# Patient Record
Sex: Female | Born: 1950 | Hispanic: No | Marital: Single | Smoking: Never smoker
Health system: Southern US, Community
[De-identification: ages and names within clinical notes are randomized; demographics above are authoritative.]

## PROBLEM LIST (undated history)

## (undated) DIAGNOSIS — M1990 Unspecified osteoarthritis, unspecified site: Secondary | ICD-10-CM

---

## 2004-04-25 ENCOUNTER — Ambulatory Visit: Payer: Self-pay | Admitting: Internal Medicine

## 2005-08-10 ENCOUNTER — Emergency Department (HOSPITAL_COMMUNITY): Admission: EM | Admit: 2005-08-10 | Discharge: 2005-08-10 | Payer: Self-pay | Admitting: Family Medicine

## 2005-08-25 ENCOUNTER — Emergency Department (HOSPITAL_COMMUNITY): Admission: EM | Admit: 2005-08-25 | Discharge: 2005-08-25 | Payer: Self-pay | Admitting: Family Medicine

## 2016-11-23 ENCOUNTER — Encounter (HOSPITAL_COMMUNITY): Payer: Self-pay

## 2016-11-23 DIAGNOSIS — R1013 Epigastric pain: Secondary | ICD-10-CM | POA: Diagnosis not present

## 2016-11-23 DIAGNOSIS — R51 Headache: Secondary | ICD-10-CM | POA: Diagnosis not present

## 2016-11-23 DIAGNOSIS — R109 Unspecified abdominal pain: Secondary | ICD-10-CM | POA: Diagnosis present

## 2016-11-23 LAB — COMPREHENSIVE METABOLIC PANEL
ALBUMIN: 4.4 g/dL (ref 3.5–5.0)
ALK PHOS: 57 U/L (ref 38–126)
ALT: 22 U/L (ref 14–54)
AST: 30 U/L (ref 15–41)
Anion gap: 11 (ref 5–15)
BILIRUBIN TOTAL: 0.7 mg/dL (ref 0.3–1.2)
BUN: 8 mg/dL (ref 6–20)
CALCIUM: 9.7 mg/dL (ref 8.9–10.3)
CO2: 22 mmol/L (ref 22–32)
Chloride: 107 mmol/L (ref 101–111)
Creatinine, Ser: 0.9 mg/dL (ref 0.44–1.00)
GFR calc Af Amer: >60 mL/min (ref >60)
GFR calc non Af Amer: >60 mL/min (ref >60)
GLUCOSE: 122 mg/dL — AB (ref 65–99)
Potassium: 3.4 mmol/L — ABNORMAL LOW (ref 3.5–5.1)
Sodium: 140 mmol/L (ref 135–145)
TOTAL PROTEIN: 7.7 g/dL (ref 6.5–8.1)

## 2016-11-23 LAB — CBC
HEMATOCRIT: 38.7 % (ref 36.0–46.0)
Hemoglobin: 12.7 g/dL (ref 12.0–15.0)
MCH: 29.5 pg (ref 26.0–34.0)
MCHC: 32.8 g/dL (ref 30.0–36.0)
MCV: 90 fL (ref 78.0–100.0)
Platelets: 231 10*3/uL (ref 150–400)
RBC: 4.3 MIL/uL (ref 3.87–5.11)
RDW: 13.2 % (ref 11.5–15.5)
WBC: 7.5 10*3/uL (ref 4.0–10.5)

## 2016-11-23 LAB — LIPASE, BLOOD: Lipase: 73 U/L — ABNORMAL HIGH (ref 11–51)

## 2016-11-23 NOTE — ED Triage Notes (Signed)
Pt reports upper abdominal pain and headache. She states she is taking abx for current UTI and has been having dysuria. She denies having chest pain. LBM today.

## 2016-11-24 ENCOUNTER — Emergency Department (HOSPITAL_COMMUNITY)
Admission: EM | Admit: 2016-11-24 | Discharge: 2016-11-24 | Disposition: A | Discharge status: Home / Self Care | Payer: Medicare Other | Attending: Emergency Medicine | Admitting: Emergency Medicine

## 2016-11-24 ENCOUNTER — Emergency Department (HOSPITAL_COMMUNITY): Payer: Medicare Other

## 2016-11-24 DIAGNOSIS — R1013 Epigastric pain: Secondary | ICD-10-CM | POA: Diagnosis not present

## 2016-11-24 HISTORY — DX: Unspecified osteoarthritis, unspecified site: M19.90

## 2016-11-24 LAB — URINALYSIS, ROUTINE W REFLEX MICROSCOPIC
BACTERIA UA: NONE SEEN
Bilirubin Urine: NEGATIVE
Glucose, UA: NEGATIVE mg/dL
Hgb urine dipstick: NEGATIVE
Ketones, ur: NEGATIVE mg/dL
Nitrite: NEGATIVE
PROTEIN: NEGATIVE mg/dL
Specific Gravity, Urine: 1.005 (ref 1.005–1.030)
pH: 7 (ref 5.0–8.0)

## 2016-11-24 LAB — POC OCCULT BLOOD, ED: FECAL OCCULT BLD: NEGATIVE

## 2016-11-24 LAB — TROPONIN I

## 2016-11-24 MED ORDER — GI COCKTAIL ~~LOC~~
30.0000 mL | Freq: Once | ORAL | Status: AC
  Administered 2016-11-24: 30 mL via ORAL
  Filled 2016-11-24: qty 30

## 2016-11-24 MED ORDER — IOPAMIDOL (ISOVUE-300) INJECTION 61%
INTRAVENOUS | Status: AC
  Administered 2016-11-24: 100 mL
  Filled 2016-11-24: qty 100

## 2016-11-24 MED ORDER — OMEPRAZOLE 20 MG PO CPDR: 20.0000 mg | DELAYED_RELEASE_CAPSULE | Freq: Every day | ORAL | 0 refills | Status: AC

## 2016-11-24 MED ORDER — METOCLOPRAMIDE HCL 5 MG/ML IJ SOLN
10.0000 mg | Freq: Once | INTRAMUSCULAR | Status: AC
  Administered 2016-11-24: 10 mg via INTRAVENOUS
  Filled 2016-11-24: qty 2

## 2016-11-24 MED ORDER — SUCRALFATE 1 G PO TABS: 1.0000 g | ORAL_TABLET | Freq: Three times a day (TID) | ORAL | 0 refills | Status: AC

## 2016-11-24 MED ORDER — PANTOPRAZOLE SODIUM 40 MG IV SOLR
40.0000 mg | Freq: Once | INTRAVENOUS | Status: AC
  Administered 2016-11-24: 40 mg via INTRAVENOUS
  Filled 2016-11-24: qty 40

## 2016-11-24 MED ORDER — DIPHENHYDRAMINE HCL 50 MG/ML IJ SOLN
25.0000 mg | Freq: Once | INTRAMUSCULAR | Status: AC
  Administered 2016-11-24: 25 mg via INTRAVENOUS
  Filled 2016-11-24: qty 1

## 2016-11-24 NOTE — Discharge Instructions (Signed)
Take the stomach medications as prescribed. Followup with the stomach doctor. Your arthritis medicine may be irritating your stomach.  Ask your doctor if you can stop these. Return to the ED if you develop new or worsening symptoms.

## 2016-11-24 NOTE — ED Provider Notes (Signed)
[facility]-EMERGENCY DEPT Provider Note   CSN: [id number] Arrival date & time: 11/23/16  2006 By signing my name below, I, Margaret HedgerElizabeth Cantrell, attest that this documentation has been prepared under the direction and in the presence of [name], [name], MD . Electronically Signed: Levon HedgerElizabeth Cantrell, Scribe. 11/24/2016. 2:37 AM.   History   Chief Complaint Chief Complaint  Patient presents with  . Abdominal Pain   HPI Margaret Cantrell is a 66 y.o. female with a hx of arthritis on methotrexate and prednisone, ulcers, and HTN who presents to the Emergency Department complaining of intermittent abdominal pain onset one week ago. Pain is unchanged by inspiration. No alleviating or modifying factors noted. Pt's daughter reports associated weakness, nausea and back pain. Per daughter, she has been unable to eat due to her symptoms. She also describes a gradual onset, gradually worsening bilateral temporal headache with associated photophobia x1 week. Pt was seen by her PCP last week where she was diagnosed with a UTI  and started on Prilosec, Topamax and Macrobid. She has been compliant with her medications with no significant relief of symptoms. No abdominal SHx. No hx endoscopy.Daughter is unsure of any melena. She denies any vomiting, or decreased appetite.    The history is provided by the patient. No language interpreter was used.   Past Medical History:  Diagnosis Date  . Arthritis     There are no active problems to display for this patient.   History reviewed. No pertinent surgical history.  OB History    No data available      Home Medications    Prior to Admission medications   Not on File    Family History No family history on file.  Social History Social History  Substance Use Topics  . Smoking status: Never Smoker  . Smokeless tobacco: Never Used  . Alcohol use No   Allergies   Patient has no allergy information on record.   Review of Systems Review of Systems All systems  reviewed and are negative for acute change except as noted in the HPI.  Physical Exam Updated Vital Signs BP (!) 150/79 (BP Location: Left Arm)   Pulse 84   Temp 97.9 F (36.6 C) (Oral)   Resp 18   SpO2 100%   Physical Exam  Constitutional: She is oriented to person, place, and time. She appears well-developed and well-nourished. No distress.  Appears well  HENT:  Head: Normocephalic and atraumatic.  Mouth/Throat: Oropharynx is clear and moist. No oropharyngeal exudate.  Eyes: Conjunctivae and EOM are normal. Pupils are equal, round, and reactive to light.  Neck: Normal range of motion. Neck supple.  No meningismus.  Cardiovascular: Normal rate, regular rhythm, normal heart sounds and intact distal pulses.   No murmur heard. Pulmonary/Chest: Effort normal and breath sounds normal. No respiratory distress.  Abdominal: Soft. There is tenderness in the epigastric area and periumbilical area. There is no rebound, no guarding and no CVA tenderness.  Musculoskeletal: Normal range of motion. She exhibits no edema or tenderness.  Neurological: She is alert and oriented to person, place, and time. No cranial nerve deficit. She exhibits normal muscle tone. Coordination normal.   5/5 strength throughout. CN 2-12 intact.Equal grip strength.   Skin: Skin is warm.  Psychiatric: She has a normal mood and affect. Her behavior is normal.  Nursing note and vitals reviewed.  ED Treatments / Results  DIAGNOSTIC STUDIES:  Oxygen Saturation is 100% on RA, normal by my interpretation.    COORDINATION OF CARE:  2:33 AM Discussed treatment plan with pt at bedside and pt agreed to plan.   Labs (all labs ordered are listed, but only abnormal results are displayed) Labs Reviewed  LIPASE, BLOOD - Abnormal; Notable for the following:       Result Value   Lipase 73 (*)    All other components within normal limits  COMPREHENSIVE METABOLIC PANEL - Abnormal; Notable for the following:    Potassium 3.4  (*)    Glucose, Bld 122 (*)    All other components within normal limits  CBC  URINALYSIS, ROUTINE W REFLEX MICROSCOPIC    EKG  EKG Interpretation None       Radiology Ct Head Wo Contrast  Result Date: 11/24/2016 CLINICAL DATA:  Headache and hypertension. Previous contrast-enhanced CT abdomen and pelvis. EXAM: CT HEAD WITHOUT CONTRAST TECHNIQUE: Contiguous axial images were obtained from the base of the skull through the vertex without intravenous contrast. COMPARISON:  None. FINDINGS: Brain: Residual contrast material limits evaluation. Can't entirely exclude subtle intracranial hemorrhage. As visualized, there is no definite evidence of hemorrhage. No abnormal extra-axial fluid collections. Gray-white matter junctions are distinct. Basal cisterns are not effaced. No ventricular dilatation. No mass-effect or midline shift. Vascular: No hyperdense vessel or unexpected calcification. Skull: Normal. Negative for fracture or focal lesion. Sinuses/Orbits: No acute finding. Other: None. IMPRESSION: No acute intracranial abnormalities. Electronically Signed   By: Burman Nieves M.D.   On: 11/24/2016 06:23   Ct Abdomen Pelvis W Contrast  Result Date: 11/24/2016 CLINICAL DATA:  Epigastric pain for 5 days. EXAM: CT ABDOMEN AND PELVIS WITH CONTRAST TECHNIQUE: Multidetector CT imaging of the abdomen and pelvis was performed using the standard protocol following bolus administration of intravenous contrast. CONTRAST:  ISOVUE-300 IOPAMIDOL (ISOVUE-300) INJECTION 61% COMPARISON:  None. FINDINGS: Lower chest: Mild dependent changes in the lung bases. Hepatobiliary: Subcentimeter cyst in the left lobe of the liver. No other focal lesions identified. Gallbladder and bile ducts are unremarkable. Pancreas: Unremarkable. No pancreatic ductal dilatation or surrounding inflammatory changes. Spleen: Normal in size without focal abnormality. Adrenals/Urinary Tract: Adrenal glands are unremarkable. Kidneys are  normal, without renal calculi, focal lesion, or hydronephrosis. Bladder is unremarkable. Stomach/Bowel: Stomach is within normal limits. Appendix appears normal. No evidence of bowel wall thickening, distention, or inflammatory changes. Vascular/Lymphatic: Aortic atherosclerosis. No enlarged abdominal or pelvic lymph nodes. Reproductive: Status post hysterectomy. No adnexal masses. Other: No abdominal wall hernia or abnormality. No abdominopelvic ascites. Musculoskeletal: No acute or significant osseous findings. IMPRESSION: No acute process demonstrated in the abdomen or pelvis. No evidence of bowel obstruction or inflammation. Electronically Signed   By: Burman Nieves M.D.   On: 11/24/2016 04:23    Procedures Procedures (including critical care time)  Medications Ordered in ED Medications - No data to display   Initial Impression / Assessment and Plan / ED Course  I have reviewed the triage vital signs and the nursing notes.  Pertinent labs & imaging results that were available during my care of the patient were reviewed by me and considered in my medical decision making (see chart for details).    One week of epigastric pain and headache. Some nausea and decreased appetite. Recently treated for UTI.  Well appearing, abdomen soft without peritoneal signs. Labs with minimal lipase elevation, normal LFTs. EKG nsr. Troponin negative. Doubt ACS.  ONgoing symptoms x 5 days.   Suspect gastritis/esophagitis due to chronic steroids and MTX/  No melena. FOBT negative. CT negative for acute process.  Start PPI,  carafate. D/w PCP cessation of prednisone. Avoid alcohol, NSAIDs, caffeine.  Followup with GI for EGD.  Return precautions discussed.  Final Clinical Impressions(s) / ED Diagnoses   Final diagnoses:  Epigastric pain    New Prescriptions New Prescriptions   No medications on file   I personally performed the services described in this documentation, which was scribed in my  presence. The recorded information has been reviewed and is accurate.   Glynn Octave, MD 11/24/16 2038

## 2016-12-24 ENCOUNTER — Other Ambulatory Visit: Payer: Self-pay | Admitting: Gastroenterology

## 2016-12-24 DIAGNOSIS — R748 Abnormal levels of other serum enzymes: Secondary | ICD-10-CM

## 2017-01-05 ENCOUNTER — Ambulatory Visit
Admission: RE | Admit: 2017-01-05 | Discharge: 2017-01-05 | Disposition: A | Discharge status: Home / Self Care | Payer: Medicare Other | Source: Ambulatory Visit | Attending: Gastroenterology | Admitting: Gastroenterology

## 2017-01-05 DIAGNOSIS — R748 Abnormal levels of other serum enzymes: Secondary | ICD-10-CM

## 2017-01-05 MED ORDER — GADOBENATE DIMEGLUMINE 529 MG/ML IV SOLN
10.0000 mL | Freq: Once | INTRAVENOUS | Status: AC | PRN
  Administered 2017-01-05: 10 mL via INTRAVENOUS

## 2017-07-23 ENCOUNTER — Other Ambulatory Visit: Payer: Self-pay | Admitting: Gastroenterology

## 2017-07-23 DIAGNOSIS — R1013 Epigastric pain: Secondary | ICD-10-CM

## 2017-07-26 ENCOUNTER — Other Ambulatory Visit: Payer: Medicare Other

## 2017-07-28 ENCOUNTER — Ambulatory Visit
Admission: RE | Admit: 2017-07-28 | Discharge: 2017-07-28 | Disposition: A | Discharge status: Home / Self Care | Payer: Medicare Other | Source: Ambulatory Visit | Attending: Gastroenterology | Admitting: Gastroenterology

## 2017-07-28 DIAGNOSIS — R1013 Epigastric pain: Secondary | ICD-10-CM

## 2017-07-28 MED ORDER — IOPAMIDOL (ISOVUE-300) INJECTION 61%
100.0000 mL | Freq: Once | INTRAVENOUS | Status: AC | PRN
  Administered 2017-07-28: 100 mL via INTRAVENOUS

## 2019-02-27 IMAGING — CT CT HEAD W/O CM
3 series · 14 of 47 positions shown, 16 images · non-contrast
Comparison: None.

CLINICAL DATA: Headache and hypertension. Previous
contrast-enhanced CT abdomen and pelvis.

EXAM:
CT HEAD WITHOUT CONTRAST
TECHNIQUE: Contiguous axial images were obtained from the base of the skull
through the vertex without intravenous contrast.

[Series 2: head 5.0 h30s · axial · 0.41mm/px · z∈[-133,-8]mm · 8 of 31 slices shown, 10 images]
[im 3/31  brain]
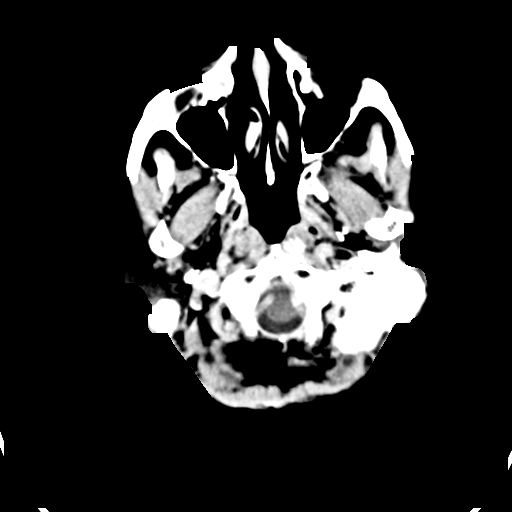
[im 3/31  bone]
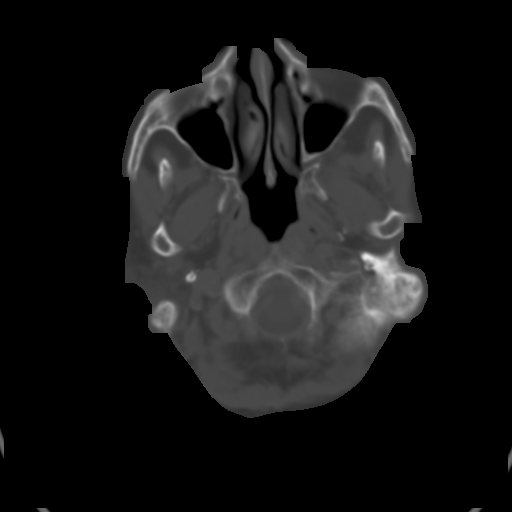
[im 7/31  brain]
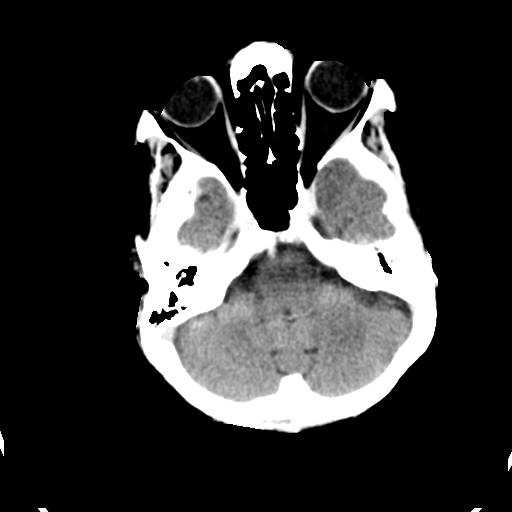
[im 10/31  brain]
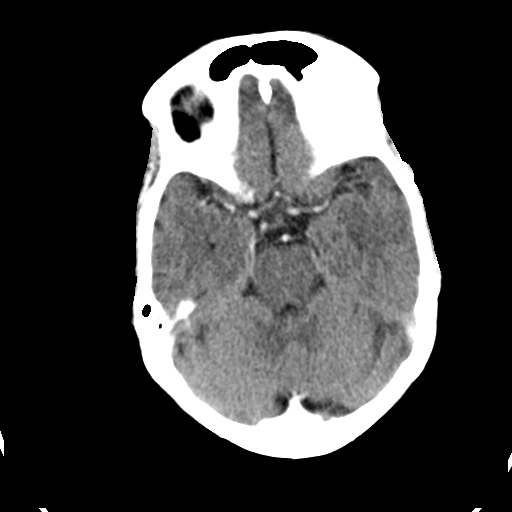
[im 14/31  brain]
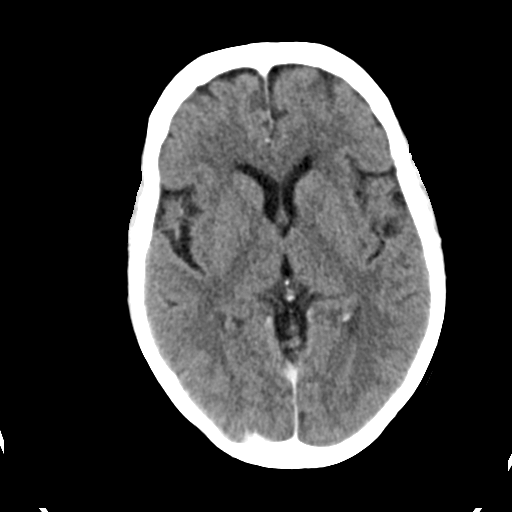
[im 17/31  brain]
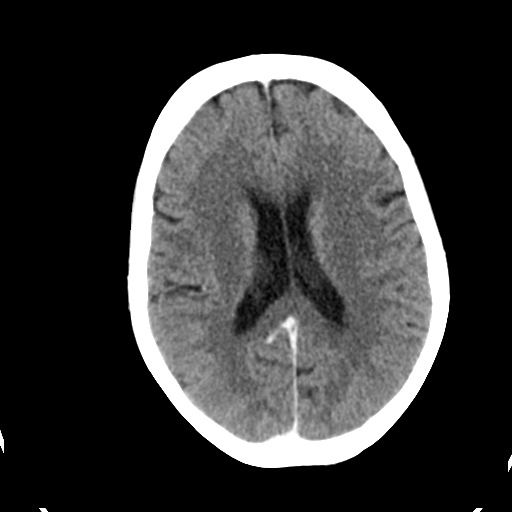
[im 17/31  bone]
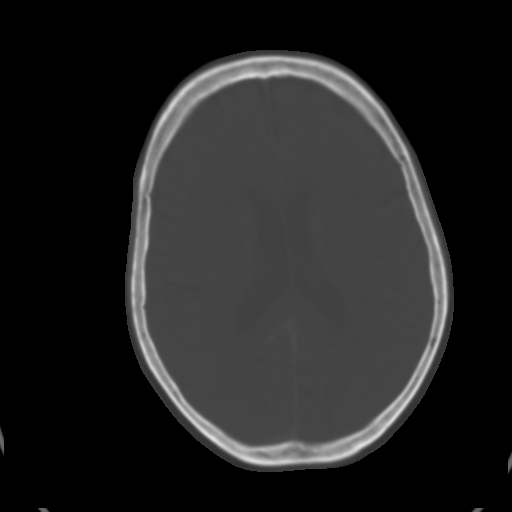
[im 21/31  brain]
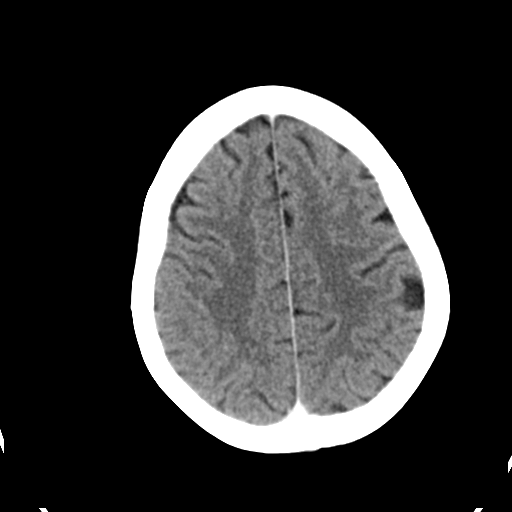
[im 24/31  brain]
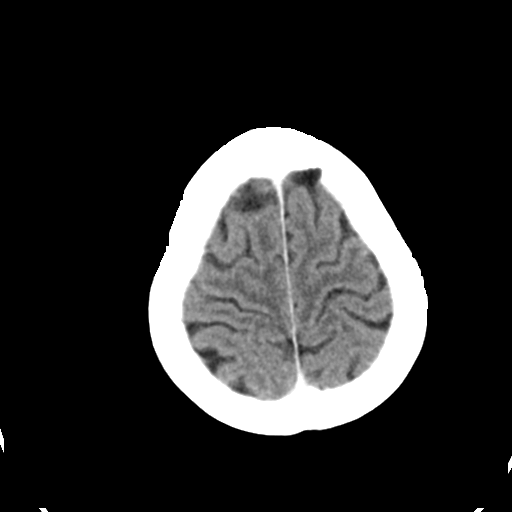
[im 28/31  brain]
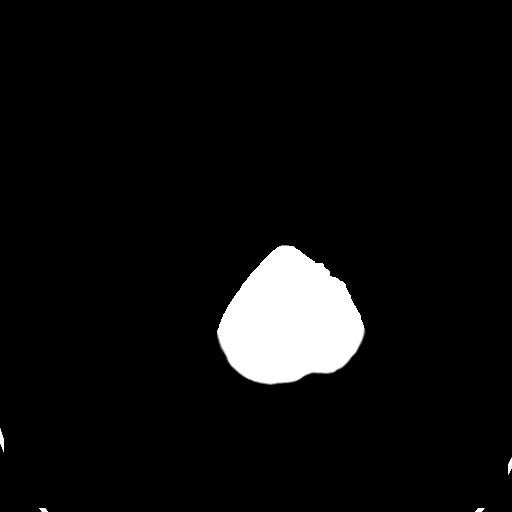

[Series 4: head 3.0 mpr cor · coronal · 0.30mm/px · 3 of 67 slices shown]
[im 23/67  brain]
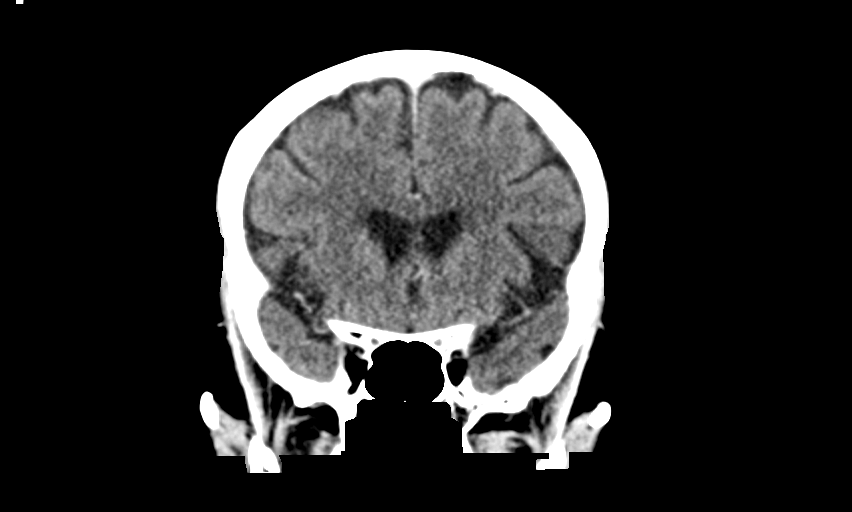
[im 30/67  brain]
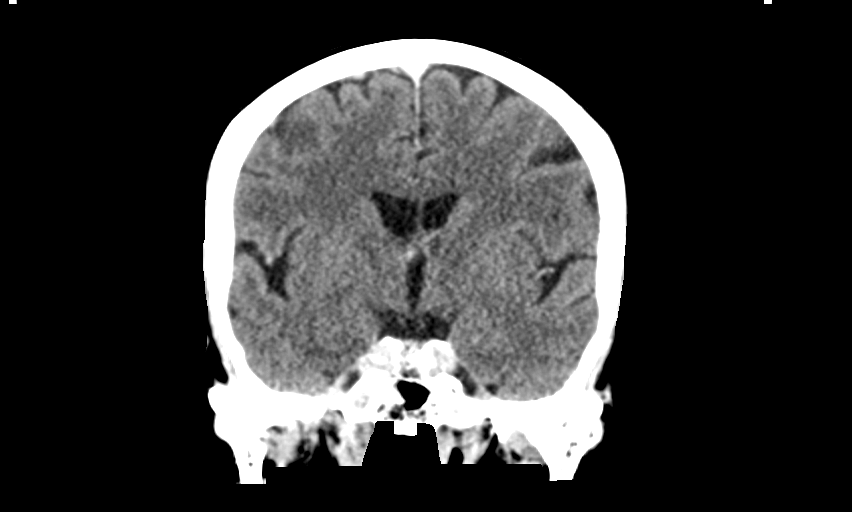
[im 37/67  brain]
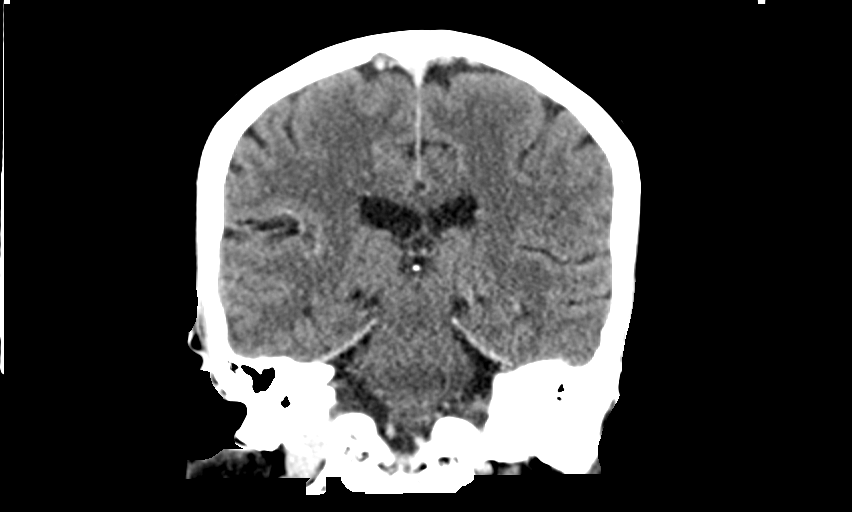

[Series 5: head 3.0 mpr sag · sagittal · 0.30mm/px · 3 of 51 slices shown]
[im 17/51  brain]
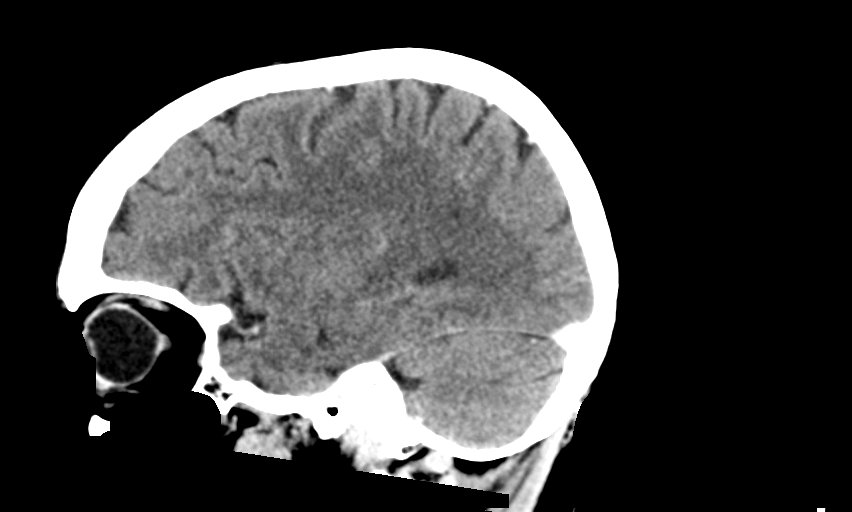
[im 26/51  brain]
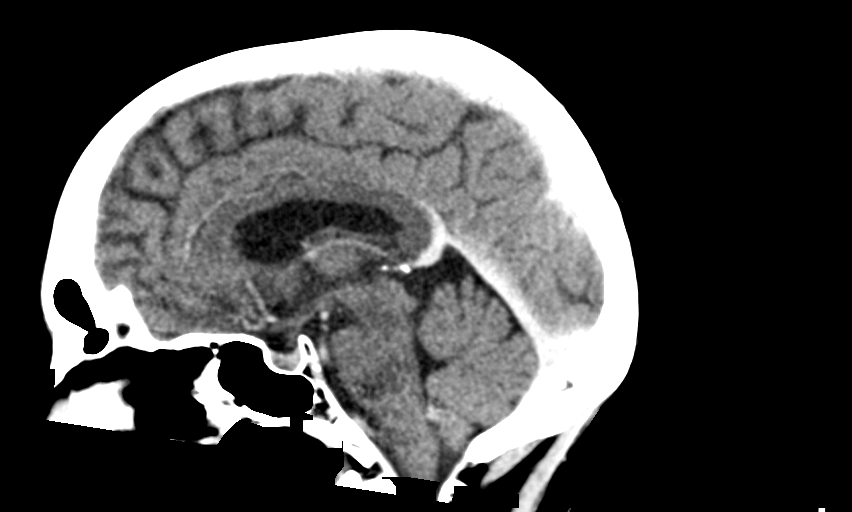
[im 34/51  brain]
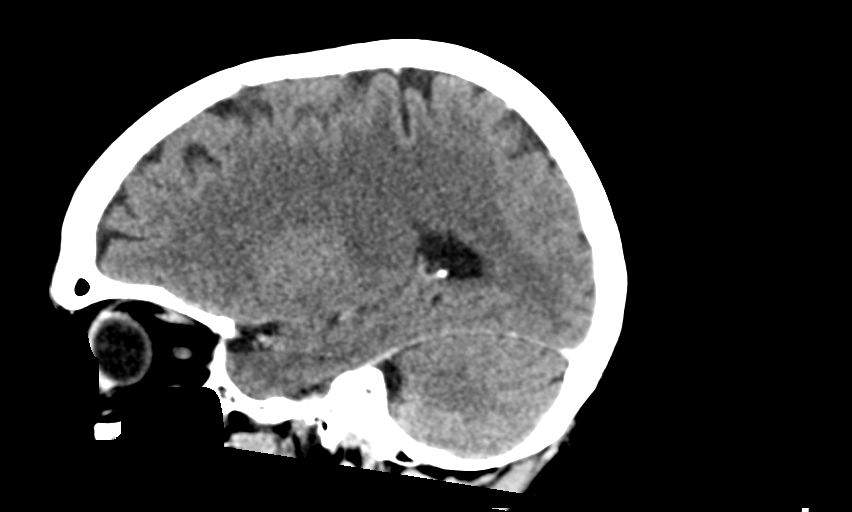

[14 of 47 positions shown; findings below may reference images not displayed]

FINDINGS: Brain: Residual contrast material limits evaluation. Can't entirely
exclude subtle intracranial hemorrhage. As visualized, there is no
definite evidence of hemorrhage. No abnormal extra-axial fluid
collections. Gray-white matter junctions are distinct. Basal
cisterns are not effaced. No ventricular dilatation. No mass-effect
or midline shift.

Vascular: No hyperdense vessel or unexpected calcification.

Skull: Normal. Negative for fracture or focal lesion.

Sinuses/Orbits: No acute finding.

Other: None.
IMPRESSION: No acute intracranial abnormalities.

## 2019-02-27 IMAGING — CT CT ABD-PELV W/ CM
2 of 5 series · 16 of 46 positions shown, 18 images · IV contrast (Omni 300)
Comparison: None.

CLINICAL DATA: Epigastric pain for 5 days.

EXAM:
CT ABDOMEN AND PELVIS WITH CONTRAST
TECHNIQUE: Multidetector CT imaging of the abdomen and pelvis was performed
using the standard protocol following bolus administration of
intravenous contrast.
CONTRAST:  100mL 3JWHPM-HXX IOPAMIDOL (3JWHPM-HXX) INJECTION 61%

[Series 3: a/p w/ 5mm · axial · 0.72mm/px · z∈[+435,+790]mm · 13 of 83 slices shown, 15 images]
[im 6/83  soft-tissue]
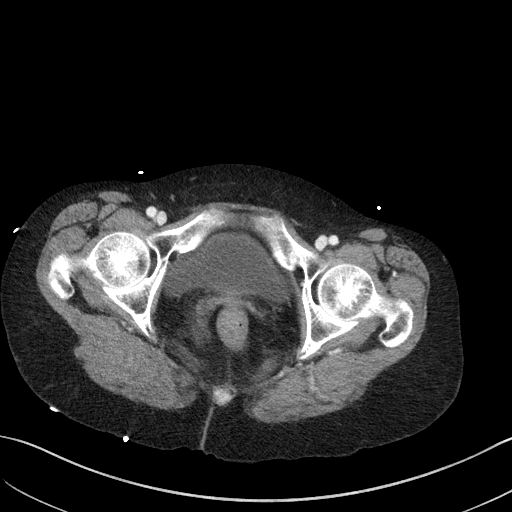
[im 6/83  bone]
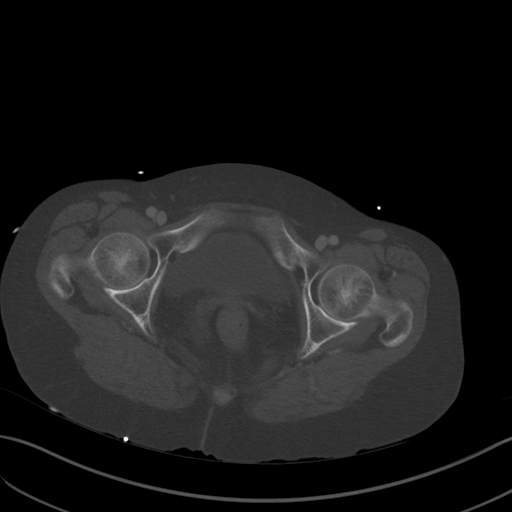
[im 11/83  soft-tissue]
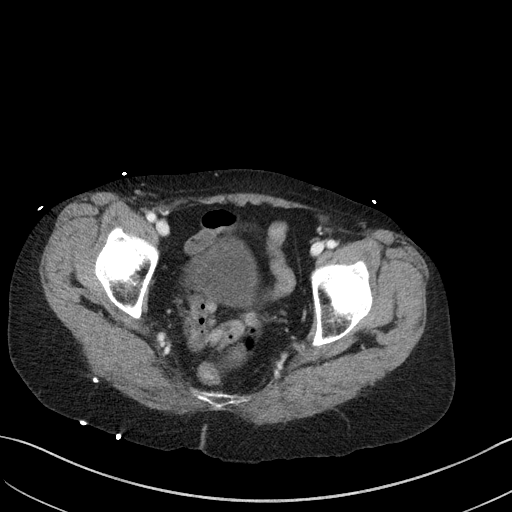
[im 17/83  soft-tissue]
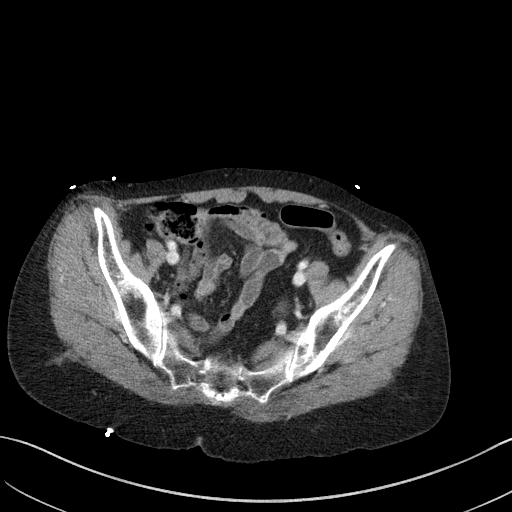
[im 22/83  soft-tissue]
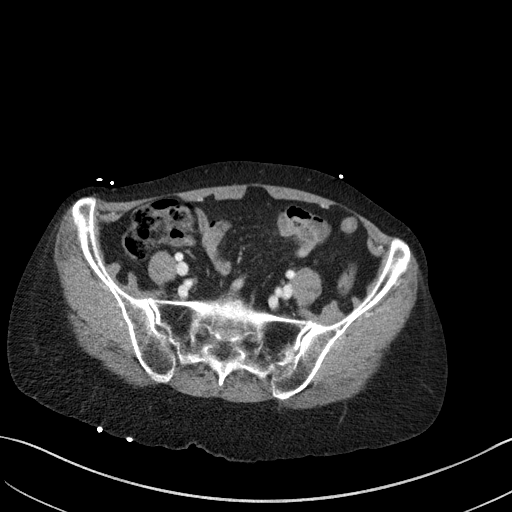
[im 28/83  soft-tissue]
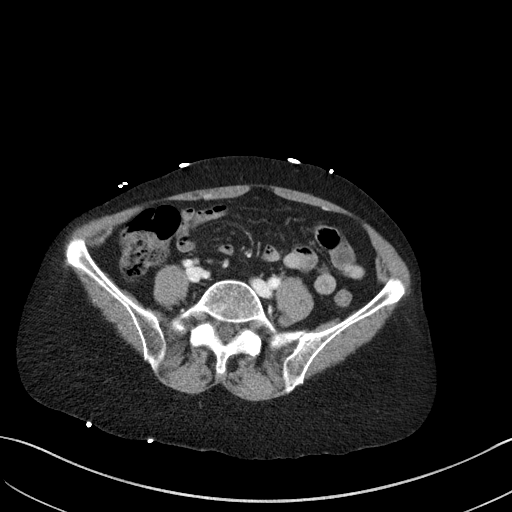
[im 33/83  soft-tissue]
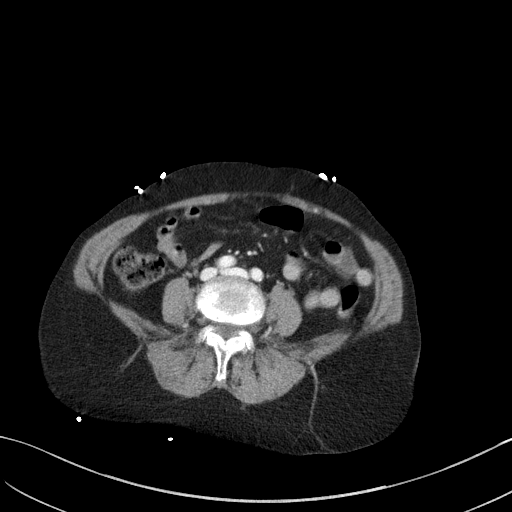
[im 44/83  soft-tissue]
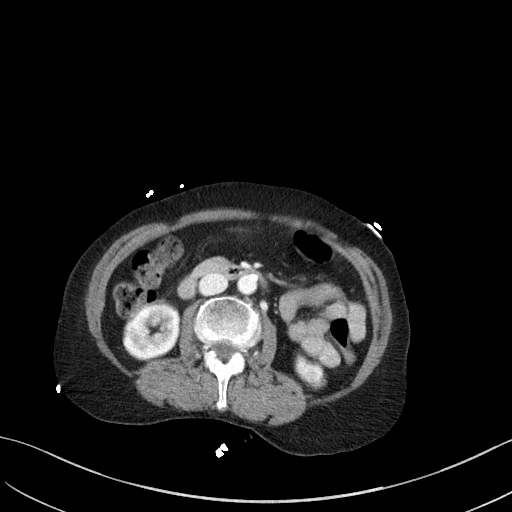
[im 50/83  soft-tissue]
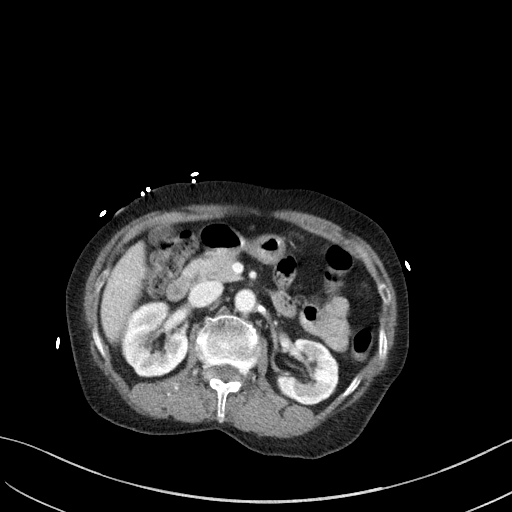
[im 55/83  soft-tissue]
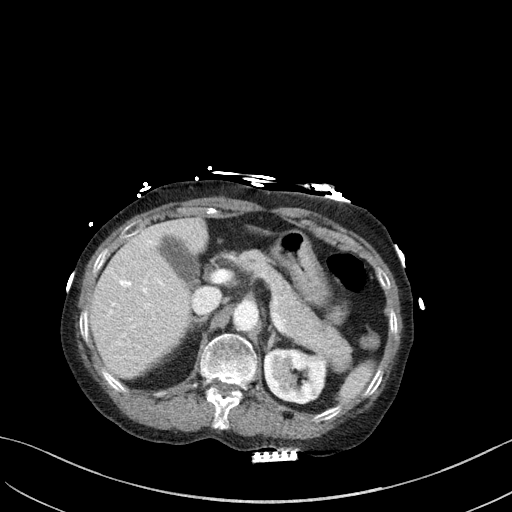
[im 55/83  bone]
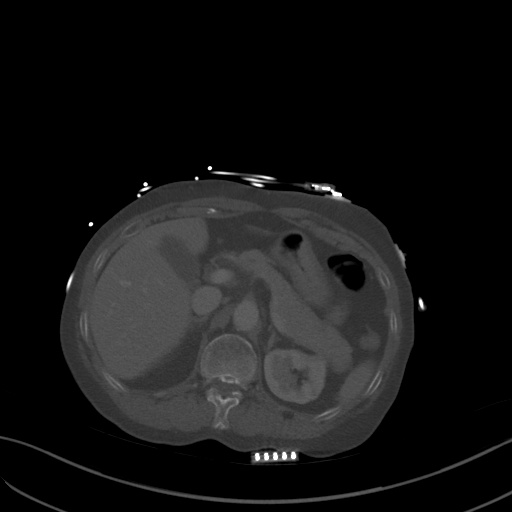
[im 61/83  soft-tissue]
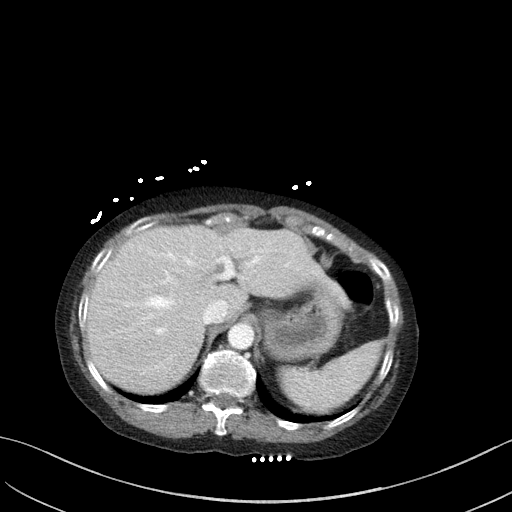
[im 66/83  soft-tissue]
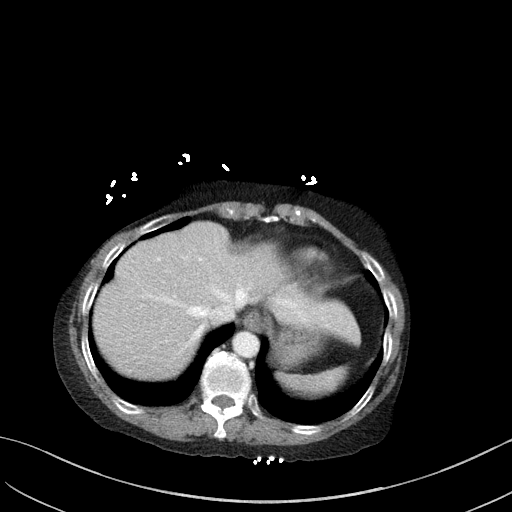
[im 72/83  soft-tissue]
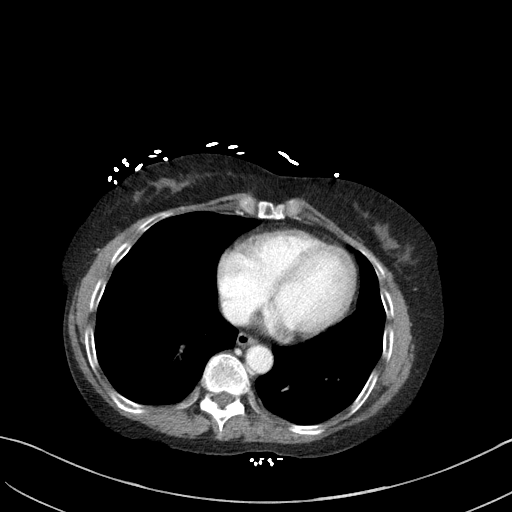
[im 77/83  soft-tissue]
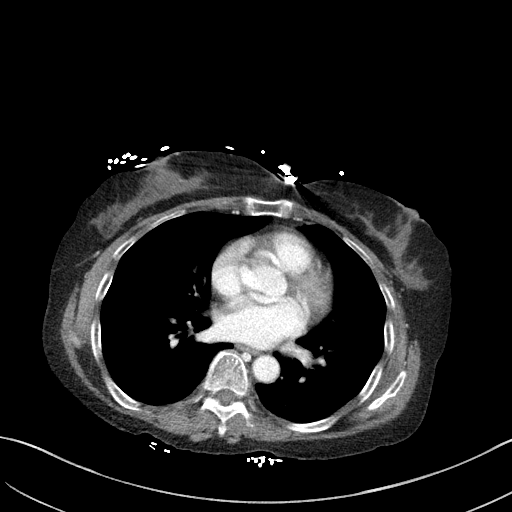

[Series 6: a/p w/ cor · coronal · 0.62mm/px · 3 of 101 slices shown]
[im 34/101  soft-tissue]
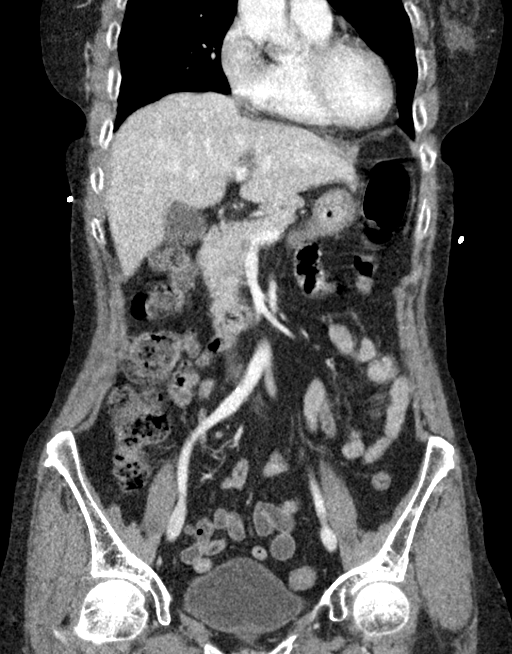
[im 45/101  soft-tissue]
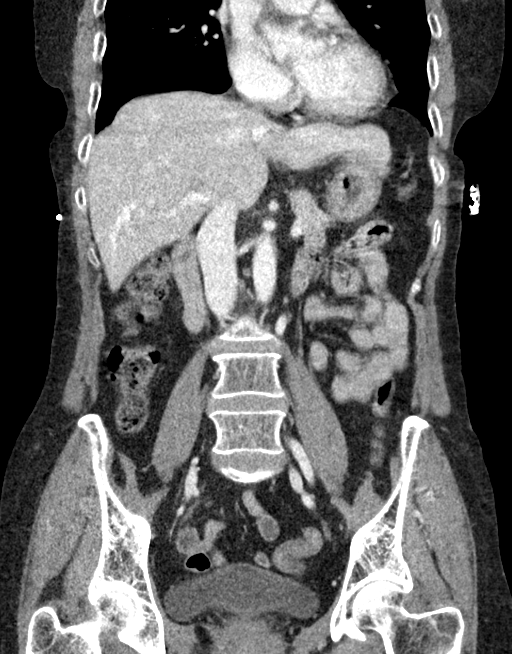
[im 56/101  soft-tissue]
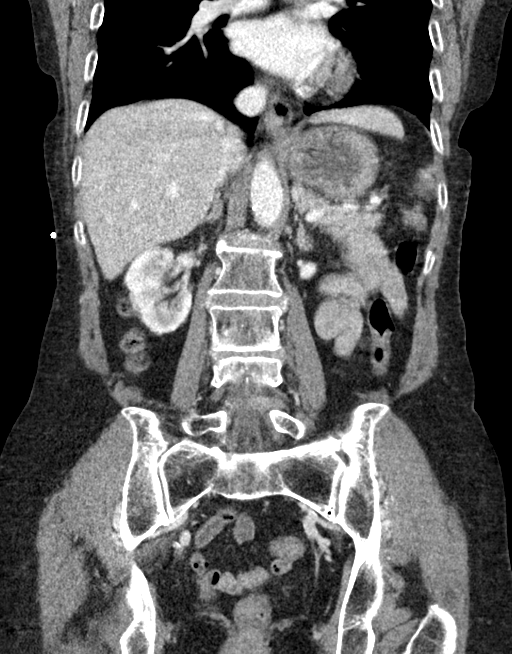

[16 of 46 positions shown; findings below may reference images not displayed]

FINDINGS: Lower chest: Mild dependent changes in the lung bases.

Hepatobiliary: Subcentimeter cyst in the left lobe of the liver. No
other focal lesions identified. Gallbladder and bile ducts are
unremarkable.

Pancreas: Unremarkable. No pancreatic ductal dilatation or
surrounding inflammatory changes.

Spleen: Normal in size without focal abnormality.

Adrenals/Urinary Tract: Adrenal glands are unremarkable. Kidneys are
normal, without renal calculi, focal lesion, or hydronephrosis.
Bladder is unremarkable.

Stomach/Bowel: Stomach is within normal limits. Appendix appears
normal. No evidence of bowel wall thickening, distention, or
inflammatory changes.

Vascular/Lymphatic: Aortic atherosclerosis. No enlarged abdominal or
pelvic lymph nodes.

Reproductive: Status post hysterectomy. No adnexal masses.

Other: No abdominal wall hernia or abnormality. No abdominopelvic
ascites.

Musculoskeletal: No acute or significant osseous findings.
IMPRESSION: No acute process demonstrated in the abdomen or pelvis. No evidence
of bowel obstruction or inflammation.

## 2022-02-18 ENCOUNTER — Encounter (HOSPITAL_BASED_OUTPATIENT_CLINIC_OR_DEPARTMENT_OTHER): Payer: Self-pay | Admitting: Urology

## 2022-02-18 ENCOUNTER — Emergency Department (HOSPITAL_BASED_OUTPATIENT_CLINIC_OR_DEPARTMENT_OTHER)
Admission: EM | Admit: 2022-02-18 | Discharge: 2022-02-18 | Disposition: A | Discharge status: Home / Self Care | Payer: 59 | Attending: Emergency Medicine | Admitting: Emergency Medicine

## 2022-02-18 DIAGNOSIS — R748 Abnormal levels of other serum enzymes: Secondary | ICD-10-CM | POA: Diagnosis not present

## 2022-02-18 DIAGNOSIS — N3000 Acute cystitis without hematuria: Secondary | ICD-10-CM

## 2022-02-18 DIAGNOSIS — E119 Type 2 diabetes mellitus without complications: Secondary | ICD-10-CM | POA: Diagnosis not present

## 2022-02-18 DIAGNOSIS — K219 Gastro-esophageal reflux disease without esophagitis: Secondary | ICD-10-CM

## 2022-02-18 DIAGNOSIS — E039 Hypothyroidism, unspecified: Secondary | ICD-10-CM | POA: Diagnosis not present

## 2022-02-18 DIAGNOSIS — R1013 Epigastric pain: Secondary | ICD-10-CM | POA: Diagnosis present

## 2022-02-18 DIAGNOSIS — I10 Essential (primary) hypertension: Secondary | ICD-10-CM | POA: Insufficient documentation

## 2022-02-18 LAB — CBC WITH DIFFERENTIAL/PLATELET
Abs Immature Granulocytes: 0.03 10*3/uL (ref 0.00–0.07)
Basophils Absolute: 0 10*3/uL (ref 0.0–0.1)
Basophils Relative: 1 %
Eosinophils Absolute: 0.2 10*3/uL (ref 0.0–0.5)
Eosinophils Relative: 2 %
HCT: 37.6 % (ref 36.0–46.0)
Hemoglobin: 12.3 g/dL (ref 12.0–15.0)
Immature Granulocytes: 0 %
Lymphocytes Relative: 17 %
Lymphs Abs: 1.5 10*3/uL (ref 0.7–4.0)
MCH: 30 pg (ref 26.0–34.0)
MCHC: 32.7 g/dL (ref 30.0–36.0)
MCV: 91.7 fL (ref 80.0–100.0)
Monocytes Absolute: 1.1 10*3/uL — ABNORMAL HIGH (ref 0.1–1.0)
Monocytes Relative: 12 %
Neutro Abs: 6.1 10*3/uL (ref 1.7–7.7)
Neutrophils Relative %: 68 %
Platelets: 215 10*3/uL (ref 150–400)
RBC: 4.1 MIL/uL (ref 3.87–5.11)
RDW: 14.1 % (ref 11.5–15.5)
WBC: 8.9 10*3/uL (ref 4.0–10.5)
nRBC: 0 % (ref 0.0–0.2)

## 2022-02-18 LAB — URINALYSIS, ROUTINE W REFLEX MICROSCOPIC
Bilirubin Urine: NEGATIVE
Glucose, UA: NEGATIVE mg/dL
Hgb urine dipstick: NEGATIVE
Ketones, ur: NEGATIVE mg/dL
Nitrite: NEGATIVE
Protein, ur: NEGATIVE mg/dL
Specific Gravity, Urine: 1.01 (ref 1.005–1.030)
pH: 7 (ref 5.0–8.0)

## 2022-02-18 LAB — COMPREHENSIVE METABOLIC PANEL
ALT: 36 U/L (ref 0–44)
AST: 31 U/L (ref 15–41)
Albumin: 4.1 g/dL (ref 3.5–5.0)
Alkaline Phosphatase: 54 U/L (ref 38–126)
Anion gap: 7 (ref 5–15)
BUN: 9 mg/dL (ref 8–23)
CO2: 26 mmol/L (ref 22–32)
Calcium: 9.4 mg/dL (ref 8.9–10.3)
Chloride: 107 mmol/L (ref 98–111)
Creatinine, Ser: 0.9 mg/dL (ref 0.44–1.00)
GFR, Estimated: >60 mL/min (ref >60)
Glucose, Bld: 101 mg/dL — ABNORMAL HIGH (ref 70–99)
Potassium: 3.4 mmol/L — ABNORMAL LOW (ref 3.5–5.1)
Sodium: 140 mmol/L (ref 135–145)
Total Bilirubin: 0.6 mg/dL (ref 0.3–1.2)
Total Protein: 7.8 g/dL (ref 6.5–8.1)

## 2022-02-18 LAB — URINALYSIS, MICROSCOPIC (REFLEX)

## 2022-02-18 LAB — LIPASE, BLOOD: Lipase: 57 U/L — ABNORMAL HIGH (ref 11–51)

## 2022-02-18 LAB — TROPONIN I (HIGH SENSITIVITY): Troponin I (High Sensitivity): 9 ng/L (ref <18)

## 2022-02-18 MED ORDER — LACTATED RINGERS IV BOLUS
1000.0000 mL | Freq: Once | INTRAVENOUS | Status: AC
  Administered 2022-02-18: 1000 mL via INTRAVENOUS

## 2022-02-18 MED ORDER — HYOSCYAMINE SULFATE 0.125 MG SL SUBL
0.2500 mg | SUBLINGUAL_TABLET | Freq: Once | SUBLINGUAL | Status: AC
  Administered 2022-02-18: 0.25 mg via SUBLINGUAL
  Filled 2022-02-18: qty 2

## 2022-02-18 MED ORDER — SUCRALFATE 1 GM/10ML PO SUSP
1.0000 g | Freq: Once | ORAL | Status: AC
  Administered 2022-02-18: 1 g via ORAL
  Filled 2022-02-18: qty 10

## 2022-02-18 MED ORDER — CEPHALEXIN 500 MG PO CAPS: 500.0000 mg | ORAL_CAPSULE | Freq: Two times a day (BID) | ORAL | 0 refills | Status: AC

## 2022-02-18 MED ORDER — PANTOPRAZOLE SODIUM 40 MG IV SOLR
40.0000 mg | Freq: Once | INTRAVENOUS | Status: AC
  Administered 2022-02-18: 40 mg via INTRAVENOUS
  Filled 2022-02-18: qty 10

## 2022-02-18 MED ORDER — ALUM & MAG HYDROXIDE-SIMETH 200-200-20 MG/5ML PO SUSP
30.0000 mL | Freq: Once | ORAL | Status: AC
  Administered 2022-02-18: 30 mL via ORAL
  Filled 2022-02-18: qty 30

## 2022-02-18 NOTE — Discharge Instructions (Addendum)
Your work-up today was very reassuring.  Your heart appears fine and your labs were all normal aside from a small urinary tract infection.  I have sent an antibiotic for you to pick up and you can take this twice daily for 5 days.  I am glad that you are feeling much better after the medications and GI cocktail.  This is likely a flareup from your acid reflux given that the rest of your work-up was normal.  Continue taking your regular home medications for this and follow-up with your primary care if your symptoms continue or worsen.

## 2022-02-18 NOTE — ED Provider Notes (Signed)
[facility] EMERGENCY DEPARTMENT Provider Note   CSN: [id number] Arrival date & time: 02/18/22  1206     History  Chief Complaint  Patient presents with   Gastroesophageal Reflux    Margaret Cantrell is a 71 y.o. female with history of hypothyroidism, GERD, type 2 diabetes, hypertension, bilateral carotid artery occlusion and stenosis, abdominal aortic ectasia, hyperlipidemia and abdominal aortic aneurysm presents to the emergency department for evaluation of GERD flareup that started 2 days ago.  Patient is very familiar with the symptoms of her acid reflux which include burning epigastric pain that radiates up into the chest.  She states symptoms have been exacerbated over the last couple of days and not responding well to her current medications.  She is typically on sucralfate and Protonix 40 mg daily, but she has not taken her Protonix today.  She denies abdominal pain, nausea, vomiting and diarrhea although she has been having some dysuria on and off for some time.  Denies fevers, shortness of breath, hematuria.   Gastroesophageal Reflux Associated symptoms include abdominal pain. Pertinent negatives include no shortness of breath.       Home Medications Prior to Admission medications   Medication Sig Start Date End Date Taking? Authorizing Provider  cephALEXin (KEFLEX) 500 MG capsule Take 1 capsule (500 mg total) by mouth 2 (two) times daily for 5 days. 02/18/22 02/23/22 Yes Janell Quiet, PA-C  folic acid (FOLVITE) 1 MG tablet Take 1 mg by mouth daily.    [provider]  hydrochlorothiazide (HYDRODIURIL) 25 MG tablet Take 25 mg by mouth daily.    [provider]  methotrexate (RHEUMATREX) 2.5 MG tablet Take 5 mg by mouth 2 (two) times a week. Friday and Saturday Caution:Chemotherapy. Protect from light.    [provider]  nitrofurantoin, macrocrystal-monohydrate, (MACROBID) 100 MG capsule Take 100 mg by mouth 2 (two) times daily.    [provider]  omeprazole (PRILOSEC) 20 MG capsule Take 1 capsule (20 mg total) by mouth daily. 11/24/16   Rancour, Jeannett Senior, MD  predniSONE (DELTASONE) 5 MG tablet Take 5 mg by mouth daily with breakfast.    [provider]  ranitidine (ZANTAC) 150 MG tablet Take 150 mg by mouth 2 (two) times daily.    [provider]  sucralfate (CARAFATE) 1 g tablet Take 1 tablet (1 g total) by mouth 4 (four) times daily -  with meals and at bedtime. 11/24/16   Rancour, Jeannett Senior, MD  topiramate (TOPAMAX) 50 MG tablet Take 50 mg by mouth 2 (two) times daily.    [provider]      Allergies    Patient has no known allergies.    Review of Systems   Review of Systems  Constitutional:  Negative for fever.  Respiratory:  Negative for shortness of breath.   Gastrointestinal:  Positive for abdominal pain. Negative for diarrhea, nausea and vomiting.  Genitourinary:  Positive for dysuria. Negative for hematuria.    Physical Exam Updated Vital Signs BP (!) 161/99 (BP Location: Right Arm)   Pulse 81   Temp 98.2 F (36.8 C) (Oral)   Resp 19   Ht 5\' 4"  (1.626 m)   SpO2 100%  Physical Exam Vitals and nursing note reviewed.  Constitutional:      General: She is not in acute distress.    Appearance: She is not ill-appearing.  HENT:     Head: Atraumatic.  Eyes:     Conjunctiva/sclera: Conjunctivae normal.  Cardiovascular:  Rate and Rhythm: Normal rate and regular rhythm.     Pulses: Normal pulses.     Heart sounds: No murmur heard. Pulmonary:     Effort: Pulmonary effort is normal. No respiratory distress.     Breath sounds: Normal breath sounds.  Abdominal:     General: Abdomen is flat. There is no distension.     Palpations: Abdomen is soft.     Tenderness: There is abdominal tenderness in the epigastric area.     Comments: Very mild epigastric tenderness to palpation.  Abdomen is otherwise soft, nondistended.  Negative CVA tenderness bilaterally.  Negative Murphy,  negative McBurney.  No guarding or peritoneal signs.  Musculoskeletal:        General: Normal range of motion.     Cervical back: Normal range of motion.  Skin:    General: Skin is warm and dry.     Capillary Refill: Capillary refill takes less than 2 seconds.  Neurological:     General: No focal deficit present.     Mental Status: She is alert.  Psychiatric:        Mood and Affect: Mood normal.     ED Results / Procedures / Treatments   Labs (all labs ordered are listed, but only abnormal results are displayed) Labs Reviewed  COMPREHENSIVE METABOLIC PANEL - Abnormal; Notable for the following components:      Result Value   Potassium 3.4 (*)    Glucose, Bld 101 (*)    All other components within normal limits  CBC WITH DIFFERENTIAL/PLATELET - Abnormal; Notable for the following components:   Monocytes Absolute 1.1 (*)    All other components within normal limits  URINALYSIS, ROUTINE W REFLEX MICROSCOPIC - Abnormal; Notable for the following components:   Leukocytes,Ua SMALL (*)    All other components within normal limits  LIPASE, BLOOD - Abnormal; Notable for the following components:   Lipase 57 (*)    All other components within normal limits  URINALYSIS, MICROSCOPIC (REFLEX) - Abnormal; Notable for the following components:   Bacteria, UA RARE (*)    Non Squamous Epithelial PRESENT (*)    All other components within normal limits  URINE CULTURE  TROPONIN I (HIGH SENSITIVITY)    EKG EKG Interpretation  Date/Time:  Wednesday February 18 2022 12:24:05 EDT Ventricular Rate:  89 PR Interval:  136 QRS Duration: 80 QT Interval:  342 QTC Calculation: 416 R Axis:   70 Text Interpretation: Normal sinus rhythm ST & T wave abnormality, consider inferolateral ischemia Abnormal ECG When compared with ECG of 24-Nov-2016 02:35, PREVIOUS ECG IS PRESENT Confirmed by Ernie Avena ([id number]) on 02/18/2022 1:27:41 PM  Radiology No results found.  Procedures Procedures     Medications Ordered in ED Medications  lactated ringers bolus 1,000 mL (0 mLs Intravenous Stopped 02/18/22 1558)  pantoprazole (PROTONIX) injection 40 mg (40 mg Intravenous Given 02/18/22 1455)  sucralfate (CARAFATE) 1 GM/10ML suspension 1 g (1 g Oral Given 02/18/22 1517)  hyoscyamine (LEVSIN SL) SL tablet 0.25 mg (0.25 mg Sublingual Given 02/18/22 1442)  alum & mag hydroxide-simeth (MAALOX/MYLANTA) 200-200-20 MG/5ML suspension 30 mL (30 mLs Oral Given 02/18/22 1442)    ED Course/ Medical Decision Making/ A&P                           Medical Decision Making Amount and/or Complexity of Data Reviewed Labs: ordered.  Risk OTC drugs. Prescription drug management.   Social determinants of health:  Social History   Socioeconomic History   Marital status: Single    Spouse name: Not on file   Number of children: Not on file   Years of education: Not on file   Highest education level: Not on file  Occupational History   Not on file  Tobacco Use   Smoking status: Never   Smokeless tobacco: Never  Substance and Sexual Activity   Alcohol use: No   Drug use: Not on file   Sexual activity: Not on file  Other Topics Concern   Not on file  Social History Narrative   Not on file   Social Determinants of Health   Financial Resource Strain: Not on file  Food Insecurity: Not on file  Transportation Needs: Not on file  Physical Activity: Not on file  Stress: Not on file  Social Connections: Not on file  Intimate Partner Violence: Not on file     Initial impression:  This patient presents to the ED for concern of burning epigastric pain, this involves an extensive number of treatment options, and is a complaint that carries with it a high risk of complications and morbidity.   Differentials include acid reflux, ACS, AAA, kidney stone, pancreatitis, history.   Comorbidities affecting care:  Numerous, per HPI  Additional history obtained: Daughter  Lab Tests  I Ordered,  reviewed, and interpreted labs and EKG.  The pertinent results include:  UA with mild infection Lipase mildly elevated-noted on previous labs  EKG: Normal sinus rhythm  Cardiac Monitoring:  The patient was maintained on a cardiac monitor.  I personally viewed and interpreted the cardiac monitored which showed an underlying rhythm of: NSR   Medicines ordered and prescription drug management:  I ordered medication including: 1 L LR bolus GI cocktail Protonix 40 mg IV Carafate 1 g IV Reevaluation of the patient after these medicines showed that the patient resolved I have reviewed the patients home medicines and have made adjustments as needed   ED Course/Re-evaluation: Presents well-appearing and is nontoxic.  Vitals are without significant abnormality.  She is satting well on room air.  Abdomen is soft, nondistended with very mild epigastric tenderness to palpation.  Negative CVA tenderness bilaterally.  Her symptoms are consistent with acid reflux, however given her cardiovascular history I did order troponin to rule out ACS.  Symptoms do not appear consistent with a AAA rupture.  She was given GI cocktail, IV fluids, Protonix and Carafate.   Labs were overall reassuring.  Her troponin was normal.  CMP without acute findings and CBC was normal.  Her lipase was very mildly elevated which has been documented in the past and daughter notes that patient does have a history of pancreatitis.  UA with mild evidence of infection.  Will discharge home with Keflex.  Advised patient to continue with her regular home meds for acid reflux and if these do not manage her symptoms well, she should follow-up with her PCP to discuss medication changes.  If she has worsening in symptoms, she can return to the emergency department  Disposition:  After consideration of the diagnostic results, physical exam, history and the patients response to treatment feel that the patent would benefit from discharge.    GERD Acute cystitis without hematuria: Plan and management as described above. Discharged home in good condition.  Final Clinical Impression(s) / ED Diagnoses Final diagnoses:  Gastroesophageal reflux disease without esophagitis  Acute cystitis without hematuria    Rx / DC Orders ED Discharge Orders  Ordered    cephALEXin (KEFLEX) 500 MG capsule  2 times daily        02/18/22 1618              Janell Quiet, [place] 02/18/22 1627    Ernie Avena, MD 02/18/22 [phone]

## 2022-02-18 NOTE — ED Triage Notes (Signed)
Pt states acid indigestion with burning abdominal pain x 2 days  States burning sensation worse today and into chest  Takes protonix but not today

## 2022-02-19 LAB — URINE CULTURE: Culture: <10000 — AB

## 2022-09-16 ENCOUNTER — Other Ambulatory Visit: Payer: Self-pay | Admitting: Internal Medicine

## 2022-09-16 DIAGNOSIS — R1032 Left lower quadrant pain: Secondary | ICD-10-CM

## 2022-10-13 ENCOUNTER — Ambulatory Visit
Admission: RE | Admit: 2022-10-13 | Discharge: 2022-10-13 | Disposition: A | Discharge status: Home / Self Care | Payer: 59 | Source: Ambulatory Visit | Attending: Internal Medicine | Admitting: Internal Medicine

## 2022-10-13 DIAGNOSIS — R1032 Left lower quadrant pain: Secondary | ICD-10-CM

## 2022-10-13 MED ORDER — IOPAMIDOL (ISOVUE-300) INJECTION 61%
100.0000 mL | Freq: Once | INTRAVENOUS | Status: AC | PRN
  Administered 2022-10-13: 80 mL via INTRAVENOUS

## 2024-07-21 ENCOUNTER — Ambulatory Visit
Admission: RE | Admit: 2024-07-21 | Discharge: 2024-07-21 | Disposition: A | Discharge status: Home / Self Care | Source: Ambulatory Visit | Attending: Family | Admitting: Family

## 2024-07-21 ENCOUNTER — Other Ambulatory Visit: Payer: Self-pay | Admitting: Family

## 2024-07-21 DIAGNOSIS — R52 Pain, unspecified: Secondary | ICD-10-CM
